# Patient Record
Sex: Female | Born: 1985 | Race: White | Hispanic: No | Marital: Single | Smoking: Never smoker
Health system: Southern US, Community
[De-identification: ages and names within clinical notes are randomized; demographics above are authoritative.]

---

## 2014-03-22 ENCOUNTER — Observation Stay (HOSPITAL_COMMUNITY)
Admission: EM | Admit: 2014-03-22 | Discharge: 2014-03-24 | Disposition: A | Discharge status: Home / Self Care | Payer: Medicaid Other | Attending: Family Medicine | Admitting: Family Medicine

## 2014-03-22 ENCOUNTER — Emergency Department (HOSPITAL_COMMUNITY): Payer: Medicaid Other

## 2014-03-22 ENCOUNTER — Encounter (HOSPITAL_COMMUNITY): Payer: Self-pay | Admitting: Emergency Medicine

## 2014-03-22 DIAGNOSIS — O99891 Other specified diseases and conditions complicating pregnancy: Principal | ICD-10-CM | POA: Insufficient documentation

## 2014-03-22 DIAGNOSIS — R310 Gross hematuria: Secondary | ICD-10-CM | POA: Insufficient documentation

## 2014-03-22 DIAGNOSIS — O468X9 Other antepartum hemorrhage, unspecified trimester: Secondary | ICD-10-CM | POA: Insufficient documentation

## 2014-03-22 DIAGNOSIS — E860 Dehydration: Secondary | ICD-10-CM | POA: Insufficient documentation

## 2014-03-22 DIAGNOSIS — O4590 Premature separation of placenta, unspecified, unspecified trimester: Secondary | ICD-10-CM

## 2014-03-22 DIAGNOSIS — A084 Viral intestinal infection, unspecified: Secondary | ICD-10-CM | POA: Diagnosis present

## 2014-03-22 DIAGNOSIS — O9989 Other specified diseases and conditions complicating pregnancy, childbirth and the puerperium: Principal | ICD-10-CM

## 2014-03-22 DIAGNOSIS — O468X1 Other antepartum hemorrhage, first trimester: Secondary | ICD-10-CM

## 2014-03-22 DIAGNOSIS — K5289 Other specified noninfective gastroenteritis and colitis: Secondary | ICD-10-CM

## 2014-03-22 DIAGNOSIS — R519 Headache, unspecified: Secondary | ICD-10-CM | POA: Diagnosis present

## 2014-03-22 DIAGNOSIS — R51 Headache: Secondary | ICD-10-CM | POA: Insufficient documentation

## 2014-03-22 DIAGNOSIS — Z3490 Encounter for supervision of normal pregnancy, unspecified, unspecified trimester: Secondary | ICD-10-CM

## 2014-03-22 DIAGNOSIS — O210 Mild hyperemesis gravidarum: Secondary | ICD-10-CM

## 2014-03-22 DIAGNOSIS — K529 Noninfective gastroenteritis and colitis, unspecified: Secondary | ICD-10-CM

## 2014-03-22 DIAGNOSIS — O211 Hyperemesis gravidarum with metabolic disturbance: Secondary | ICD-10-CM | POA: Insufficient documentation

## 2014-03-22 DIAGNOSIS — E873 Alkalosis: Secondary | ICD-10-CM | POA: Insufficient documentation

## 2014-03-22 DIAGNOSIS — O418X10 Other specified disorders of amniotic fluid and membranes, first trimester, not applicable or unspecified: Secondary | ICD-10-CM | POA: Diagnosis present

## 2014-03-22 DIAGNOSIS — A088 Other specified intestinal infections: Secondary | ICD-10-CM | POA: Insufficient documentation

## 2014-03-22 LAB — CBC WITH DIFFERENTIAL/PLATELET
Basophils Absolute: 0 10*3/uL (ref 0.0–0.1)
Basophils Relative: 0 % (ref 0–1)
EOS ABS: 0 10*3/uL (ref 0.0–0.7)
Eosinophils Relative: 0 % (ref 0–5)
HCT: 41.9 % (ref 36.0–46.0)
HEMOGLOBIN: 14.4 g/dL (ref 12.0–15.0)
LYMPHS ABS: 0.9 10*3/uL (ref 0.7–4.0)
LYMPHS PCT: 6 % — AB (ref 12–46)
MCH: 32.5 pg (ref 26.0–34.0)
MCHC: 34.4 g/dL (ref 30.0–36.0)
MCV: 94.6 fL (ref 78.0–100.0)
MONOS PCT: 3 % (ref 3–12)
Monocytes Absolute: 0.5 10*3/uL (ref 0.1–1.0)
NEUTROS PCT: 90 % — AB (ref 43–77)
Neutro Abs: 12.5 10*3/uL — ABNORMAL HIGH (ref 1.7–7.7)
Platelets: 233 10*3/uL (ref 150–400)
RBC: 4.43 MIL/uL (ref 3.87–5.11)
RDW: 12.4 % (ref 11.5–15.5)
WBC: 13.9 10*3/uL — AB (ref 4.0–10.5)

## 2014-03-22 LAB — URINALYSIS, ROUTINE W REFLEX MICROSCOPIC
Bilirubin Urine: NEGATIVE
Glucose, UA: NEGATIVE mg/dL
Ketones, ur: NEGATIVE mg/dL
Leukocytes, UA: NEGATIVE
Nitrite: NEGATIVE
PROTEIN: NEGATIVE mg/dL
Specific Gravity, Urine: 1.03 (ref 1.005–1.030)
Urobilinogen, UA: 1 mg/dL (ref 0.0–1.0)
pH: 5.5 (ref 5.0–8.0)

## 2014-03-22 LAB — COMPREHENSIVE METABOLIC PANEL
ALK PHOS: 86 U/L (ref 39–117)
ALT: 23 U/L (ref 0–35)
AST: 22 U/L (ref 0–37)
Albumin: 4.1 g/dL (ref 3.5–5.2)
BILIRUBIN TOTAL: 0.8 mg/dL (ref 0.3–1.2)
BUN: 8 mg/dL (ref 6–23)
CO2: 18 mEq/L — ABNORMAL LOW (ref 19–32)
Calcium: 9.3 mg/dL (ref 8.4–10.5)
Chloride: 98 mEq/L (ref 96–112)
Creatinine, Ser: 0.56 mg/dL (ref 0.50–1.10)
GFR calc Af Amer: >90 mL/min (ref >90)
GFR calc non Af Amer: >90 mL/min (ref >90)
GLUCOSE: 108 mg/dL — AB (ref 70–99)
POTASSIUM: 4.2 meq/L (ref 3.7–5.3)
Sodium: 136 mEq/L — ABNORMAL LOW (ref 137–147)
TOTAL PROTEIN: 7.5 g/dL (ref 6.0–8.3)

## 2014-03-22 LAB — URINE MICROSCOPIC-ADD ON

## 2014-03-22 LAB — HCG, QUANTITATIVE, PREGNANCY: hCG, Beta Chain, Quant, S: 56946 m[IU]/mL — ABNORMAL HIGH (ref <5)

## 2014-03-22 LAB — LIPASE, BLOOD: LIPASE: 12 U/L (ref 11–59)

## 2014-03-22 MED ORDER — ONDANSETRON HCL 4 MG/2ML IJ SOLN: 4.0000 mg | Freq: Four times a day (QID) | INTRAMUSCULAR | Status: DC | PRN

## 2014-03-22 MED ORDER — DEXTROSE-NACL 5-0.45 % IV SOLN
INTRAVENOUS | Status: DC
  Administered 2014-03-23 – 2014-03-24 (×5): via INTRAVENOUS

## 2014-03-22 MED ORDER — SODIUM CHLORIDE 0.9 % IV BOLUS (SEPSIS)
1000.0000 mL | Freq: Once | INTRAVENOUS | Status: AC
  Administered 2014-03-22: 1000 mL via INTRAVENOUS

## 2014-03-22 MED ORDER — ONDANSETRON HCL 4 MG PO TABS: 4.0000 mg | ORAL_TABLET | Freq: Four times a day (QID) | ORAL | Status: DC | PRN

## 2014-03-22 MED ORDER — ACETAMINOPHEN 10 MG/ML IV SOLN
1000.0000 mg | Freq: Four times a day (QID) | INTRAVENOUS | Status: DC | PRN
  Administered 2014-03-22: 1000 mg via INTRAVENOUS
  Filled 2014-03-22: qty 100

## 2014-03-22 MED ORDER — SODIUM CHLORIDE 0.9 % IV SOLN
INTRAVENOUS | Status: DC
  Administered 2014-03-22: 23:00:00 via INTRAVENOUS

## 2014-03-22 MED ORDER — METOCLOPRAMIDE HCL 5 MG/ML IJ SOLN
10.0000 mg | Freq: Once | INTRAMUSCULAR | Status: AC
  Administered 2014-03-22: 10 mg via INTRAVENOUS
  Filled 2014-03-22: qty 2

## 2014-03-22 MED ORDER — ONDANSETRON HCL 4 MG/2ML IJ SOLN
4.0000 mg | Freq: Once | INTRAMUSCULAR | Status: AC
  Administered 2014-03-22: 4 mg via INTRAVENOUS
  Filled 2014-03-22: qty 2

## 2014-03-22 MED ORDER — ONDANSETRON HCL 4 MG/2ML IJ SOLN
4.0000 mg | Freq: Three times a day (TID) | INTRAMUSCULAR | Status: DC | PRN
Start: 2014-03-22 — End: 2014-03-22

## 2014-03-22 MED ORDER — METOCLOPRAMIDE HCL 5 MG/ML IJ SOLN: 10.0000 mg | Freq: Four times a day (QID) | INTRAMUSCULAR | Status: DC | PRN

## 2014-03-22 MED ORDER — ONDANSETRON 4 MG PO TBDP
4.0000 mg | ORAL_TABLET | Freq: Once | ORAL | Status: AC
  Administered 2014-03-22: 4 mg via ORAL
  Filled 2014-03-22: qty 1

## 2014-03-22 NOTE — ED Notes (Addendum)
Pt in c/o n/v/d since around 530 this am, also body aches and chills, low grade fever at home- states she is [redacted] weeks pregnant but these symptoms are new for her, no distress noted- denies vaginal bleeding, c/o abdominal cramping with nausea, states the pain is constant and worse after eating or drinking

## 2014-03-22 NOTE — H&P (Signed)
Family Medicine Teaching [facility] Admission History and Physical Service Pager: [phone]  Patient name: Melanie Vargas Medical record number: [id number] Date of birth: 03/21/1986 Age: 28 y.o. Gender: female  Primary Care Provider: No PCP Per Patient Consultants: None Code Status: Full  Chief Complaint: Nausea, vomiting, dehydration  Assessment and Plan: Melanie Vargas is a 28 y.o. G3P2002 at 3312w1d by US consistent with LMP presenting with nausea, vomiting and diarrhea consistent with viral gastroenteritis superimposed on hyperemesis gravidarum. PMH is significant for hyperemesis with 2 prior pregnancies.  Viral gastroenteritis: N/V/D after exposure to children with GI bug, causing dehydration. No bloody stool. Leukocytosis more likely due to pregnancy. Supportive management.  - Reglan 10mg  IV q6h prn, zofran 4mg  IV q8h prn - NPO, IVF - Repeat CBC and BMP in AM - Will hold off on stool cultures, ova, parasites unless not improving.   Dehydration: Due to above. S/p 1L NS bolus in ED. HR improved 97 > 75; BP normo/borderline hypotensive (91/68) which pt reports is baseline. Cr/BUN appropriately decreased. Dry mucous membranes.  - Repeat NS bolus and continue MIVF with dextrose.  - Restart diet once nausea and emesis controlled - Bicarbonate 18, borderline low likely due to accomodation of respiratory alkalosis combined with GI loss due to diarrhea. Would suspect contraction alkalosis given degree of dehydration and hypochloremic alkalosis with emesis. Repeat BMP in AM  Single, viable IUP: At 1612w1d by U/S consistent with LMP and hCG 56,946. FHR 158 bpm. - Continue PNV - Would likely benefit from diclegis at discharge - U/A shows rare bacteria, 0-2 WBC, neg LE and nitrites. Hold tx pending urine culture.   Subchorionic hemorrhage, moderate: Hgb 14.4. No indication of location, but does carry somewhat increased risk of spontaneous abortion.  - Expectant management - Consider repeat U/S in  1-2 weeks to monitor   FEN/GI: 1L NS bolus, then D5 1/2NS @ 125cc/hr; NPO at this time.  Prophylaxis: SCDs, ambulation  Disposition: Admit for observation on FMTS  History of Present Illness: Melanie Vargas is a 28 y.o. G3P2002 at 5212w1d presenting with nausea, vomiting and diarrhea.   She has had nausea, vomiting and diarrhea since 5:30am this morning. She had previously been somewhat nauseated with rare emesis during this pregnancy. This continued throughout the day so she presented to the ED where she was unable to keep water down. She reports multiple large-volume diarrhea episodes without blood. Her two children had a stomach virus about 1 week ago. She reports low-grade fever. No suspicious injections, no travel. Denies abdominal pain. Zofran has improved but not resolved  She is a G3P2002 at 5712w1d by U/S today in agreement with dating by LMP. Previous 2 pregnancies were carried to term and delivered by NSVD without other complications. Had hyperemesis gravidarum with both. They were treated with IV fluids and antiemetics and did not require admission. She lives in [place] and has an appointment [facility] in [place].  Denies vaginal bleeding, contractions. She reports a mild frontal throbbing headache that started a couple of hours ago, not associated with changes in vision. She has a history of migraines and this is similar.   She takes only PNV  No EtOH, illicit drugs or prescription medications.   Review Of Systems: Per HPI Otherwise 12 point review of systems was performed and was unremarkable.  Patient Active Problem List   Diagnosis Date Noted  . Hyperemesis gravidarum 03/22/2014   Past Medical History: History reviewed. No pertinent past medical history. Occasional migraines  Past  Surgical History: History reviewed. No pertinent past surgical history. No surgeries  Social History: Never smoker.  Additional social history per HPI Please also refer to  relevant sections of EMR.  Family History: History reviewed. No pertinent family history.  Allergies and Medications: No Known Allergies Takes prenatal vitamin  Objective: BP 106/43  Pulse 64  Temp(Src) 98 F (36.7 C) (Oral)  Resp 16  Ht 5\' 7"  (1.702 m)  Wt 180 lb 1.9 oz (81.7 kg)  BMI 28.20 kg/m2  SpO2 100%  LMP 01/23/2014 Exam: General: Pleasant, ill-appearing 28 y.o. female in NAD HEENT: Dry mucous membranes, PERRL, EOMI, oropharynx clear Cardiovascular: RRR, no murmur, no JVD Respiratory: Nonlabored on ambient air, CTAB Abdomen: Normoactive BS, soft, NT, ND, fundus not palpated Extremities: WWP, trace pitting LE edema symmetrically, negative homan's  Skin: No rash, wound, lesions. Tattoo on left foot Neuro: Alert and oriented, normal speech.   Labs and Imaging: CBC BMET   Recent Labs Lab 03/22/14 1532  WBC 13.9*  HGB 14.4  HCT 41.9  PLT 233    Recent Labs Lab 03/22/14 1532  NA 136*  K 4.2  CL 98  CO2 18*  BUN 8  CREATININE 0.56  GLUCOSE 108*  CALCIUM 9.3     TRANS-ABDOMINAL AND TRANS-VAGINAL U/S (03/22/14) Intrauterine gestational sac: Visualized/normal in shape.  Yolk sac: Visualize.  Embryo: Present.  Cardiac Activity: Present.  Heart Rate: 158 bpm  CRL: 16.8 mm 8 w 1d US EDC: 10/31/2014  Maternal uterus/adnexae: There is no uterine mass. There is a  moderate size subchorionic hemorrhage. The right ovary is normal  appearing in the left ovary is not visualized. There is no pelvic  free fluid.  IMPRESSION:  1. Single live intrauterine pregnancy dating 8 weeks 1 day with an  ultrasound EDC of 10/31/2014.  2. Moderate-sized subchorionic hemorrhage. Attention on follow-up  examination is recommended.   Melanie Junkeryan Grunz, MD 03/22/2014, 10:54 PM PGY-1, [facility] FPTS Intern pager: [phone], text pages welcome  Patient seen, examined. Available data reviewed. Agree with findings, assessment, and plan as outlined by Dr. Jarvis NewcomerGrunz.  My  additional findings are documented and highlighted above in blue.   Melanie Vargas, PGY-3 Family Medicine Resident

## 2014-03-22 NOTE — ED Notes (Signed)
Pt c/o nausea; Dr. Radford PaxBeaton VO 4 mg Zofran

## 2014-03-22 NOTE — ED Provider Notes (Signed)
CSN: [id number]     Arrival date & time 03/22/14  1442 History   First MD Initiated Contact with Patient 03/22/14 1853     Chief Complaint  Patient presents with  . N/V/D       HPI Pt in c/o n/v/d since around 530 this am, also body aches and chills, low grade fever at home- states she is [redacted] weeks pregnant but these symptoms are new for her, patient has had history of hyperemesis gravidarum in the past.  This is her third pregnancy.  Patient's had no previous intra-abdominal surgeries.  Patient denies abdominal bleeding. no distress noted- denies vaginal bleeding.  Patient is 8 weeks by history.  Has not had an ultrasound today.  Last 2 pregnancies her term.  She is gravida 3 para 2 AB 0.  History reviewed. No pertinent past medical history. History reviewed. No pertinent past surgical history. History reviewed. No pertinent family history. History  Substance Use Topics  . Smoking status: Never Smoker   . Smokeless tobacco: Not on file  . Alcohol Use: Not on file   OB History   Grav Para Term Preterm Abortions TAB SAB Ect Mult Living   1              Review of Systems  All other systems reviewed and are negative  Allergies  Review of patient's allergies indicates no known allergies.  Home Medications   Prior to Admission medications   Medication Sig Start Date End Date Taking? Authorizing Provider  ondansetron (ZOFRAN-ODT) 4 MG disintegrating tablet Take 1 tablet (4 mg total) by mouth every 8 (eight) hours as needed for nausea or vomiting. 03/24/14   Hazeline Junkeryan Grunz, MD   BP 98/66  Pulse 57  Temp(Src) 97.6 F (36.4 C) (Oral)  Resp 18  Ht 5\' 7"  (1.702 m)  Wt 180 lb 1.9 oz (81.7 kg)  BMI 28.20 kg/m2  SpO2 100%  LMP 01/23/2014 Physical Exam  Nursing note and vitals reviewed. Constitutional: She is oriented to person, place, and time. She appears well-developed and well-nourished. No distress.  HENT:  Head: Normocephalic and atraumatic.  Eyes: Pupils are equal, round, and  reactive to light.  Neck: Normal range of motion.  Cardiovascular: Normal rate and intact distal pulses.   Pulmonary/Chest: No respiratory distress.  Abdominal: Normal appearance. She exhibits no distension. There is no tenderness. There is no rebound and no guarding.  Musculoskeletal: Normal range of motion.  Neurological: She is alert and oriented to person, place, and time. No cranial nerve deficit.  Skin: Skin is warm and dry. No rash noted.  Psychiatric: She has a normal mood and affect. Her behavior is normal.    ED Course  Procedures (including critical care time) Labs Review Labs Reviewed  CBC WITH DIFFERENTIAL - Abnormal; Notable for the following:    WBC 13.9 (*)    Neutrophils Relative % 90 (*)    Neutro Abs 12.5 (*)    Lymphocytes Relative 6 (*)    All other components within normal limits  COMPREHENSIVE METABOLIC PANEL - Abnormal; Notable for the following:    Sodium 136 (*)    CO2 18 (*)    Glucose, Bld 108 (*)    All other components within normal limits  HCG, QUANTITATIVE, PREGNANCY - Abnormal; Notable for the following:    hCG, Beta Chain, Quant, [place] [id number] (*)    All other components within normal limits  URINALYSIS, ROUTINE W REFLEX MICROSCOPIC - Abnormal; Notable for the following:  Color, Urine AMBER (*)    Hgb urine dipstick MODERATE (*)    All other components within normal limits  URINE MICROSCOPIC-ADD ON - Abnormal; Notable for the following:    Squamous Epithelial / LPF FEW (*)    All other components within normal limits  BASIC METABOLIC PANEL - Abnormal; Notable for the following:    Sodium 136 (*)    Potassium 3.5 (*)    Glucose, Bld 110 (*)    BUN 4 (*)    Creatinine, Ser 0.49 (*)    Calcium 8.3 (*)    All other components within normal limits  CBC - Abnormal; Notable for the following:    RBC 3.64 (*)    Hemoglobin 11.8 (*)    HCT 34.5 (*)    All other components within normal limits  BASIC METABOLIC PANEL - Abnormal; Notable for the  following:    BUN <3 (*)    All other components within normal limits  URINE CULTURE  LIPASE, BLOOD  TYPE AND SCREEN  ABO/RH    Medications  ondansetron (ZOFRAN-ODT) disintegrating tablet 4 mg (4 mg Oral Given 03/22/14 1519)  sodium chloride 0.9 % bolus 1,000 mL (0 mLs Intravenous Stopped 03/22/14 2205)  ondansetron (ZOFRAN) injection 4 mg (4 mg Intravenous Given 03/22/14 2056)  metoCLOPramide (REGLAN) injection 10 mg (10 mg Intravenous Given 03/22/14 2213)  sodium chloride 0.9 % bolus 1,000 mL (1,000 mLs Intravenous Given 03/22/14 2337)  potassium chloride SA (K-DUR,KLOR-CON) CR tablet 40 mEq (40 mEq Oral Given 03/23/14 1010)  potassium chloride SA (K-DUR,KLOR-CON) CR tablet 40 mEq (40 mEq Oral Given 03/24/14 1001)      Anion gap = 20 Imaging Review IMPRESSION: 1. Single live intrauterine pregnancy dating 8 weeks 1 day with an ultrasound EDC of 10/31/2014. 2. Moderate-sized subchorionic hemorrhage. Attention on follow-up examination is recommended.    Discussed with obstetrics gynecology who recommended patient to be admitted to medical floor with rehydration and control of vomiting.  Contact family practice who agreed to admit the patient for treatment. MDM   Final diagnoses:  Hyperemesis gravidarum        Nelia Shiobert L Janathan Bribiesca, MD 03/25/14 1123

## 2014-03-23 DIAGNOSIS — R519 Headache, unspecified: Secondary | ICD-10-CM | POA: Diagnosis present

## 2014-03-23 DIAGNOSIS — E860 Dehydration: Secondary | ICD-10-CM | POA: Diagnosis present

## 2014-03-23 DIAGNOSIS — A084 Viral intestinal infection, unspecified: Secondary | ICD-10-CM | POA: Diagnosis present

## 2014-03-23 DIAGNOSIS — R51 Headache: Secondary | ICD-10-CM | POA: Diagnosis present

## 2014-03-23 DIAGNOSIS — O418X10 Other specified disorders of amniotic fluid and membranes, first trimester, not applicable or unspecified: Secondary | ICD-10-CM | POA: Diagnosis present

## 2014-03-23 DIAGNOSIS — O468X1 Other antepartum hemorrhage, first trimester: Secondary | ICD-10-CM

## 2014-03-23 DIAGNOSIS — Z3490 Encounter for supervision of normal pregnancy, unspecified, unspecified trimester: Secondary | ICD-10-CM

## 2014-03-23 LAB — CBC
HEMATOCRIT: 34.5 % — AB (ref 36.0–46.0)
HEMOGLOBIN: 11.8 g/dL — AB (ref 12.0–15.0)
MCH: 32.4 pg (ref 26.0–34.0)
MCHC: 34.2 g/dL (ref 30.0–36.0)
MCV: 94.8 fL (ref 78.0–100.0)
Platelets: 168 10*3/uL (ref 150–400)
RBC: 3.64 MIL/uL — ABNORMAL LOW (ref 3.87–5.11)
RDW: 12.5 % (ref 11.5–15.5)
WBC: 6.7 10*3/uL (ref 4.0–10.5)

## 2014-03-23 LAB — BASIC METABOLIC PANEL
BUN: 4 mg/dL — ABNORMAL LOW (ref 6–23)
CHLORIDE: 102 meq/L (ref 96–112)
CO2: 19 mEq/L (ref 19–32)
Calcium: 8.3 mg/dL — ABNORMAL LOW (ref 8.4–10.5)
Creatinine, Ser: 0.49 mg/dL — ABNORMAL LOW (ref 0.50–1.10)
GFR calc non Af Amer: >90 mL/min (ref >90)
Glucose, Bld: 110 mg/dL — ABNORMAL HIGH (ref 70–99)
Potassium: 3.5 mEq/L — ABNORMAL LOW (ref 3.7–5.3)
Sodium: 136 mEq/L — ABNORMAL LOW (ref 137–147)

## 2014-03-23 LAB — TYPE AND SCREEN
ABO/RH(D): O POS
ANTIBODY SCREEN: NEGATIVE

## 2014-03-23 LAB — ABO/RH: ABO/RH(D): O POS

## 2014-03-23 MED ORDER — ONDANSETRON 4 MG PO TBDP: 4.0000 mg | ORAL_TABLET | Freq: Three times a day (TID) | ORAL | Status: DC | PRN

## 2014-03-23 MED ORDER — ACETAMINOPHEN 325 MG PO TABS
650.0000 mg | ORAL_TABLET | Freq: Four times a day (QID) | ORAL | Status: DC | PRN
  Administered 2014-03-23 (×2): 650 mg via ORAL
  Filled 2014-03-23 (×2): qty 2

## 2014-03-23 MED ORDER — ONDANSETRON 4 MG PO TBDP
4.0000 mg | ORAL_TABLET | Freq: Three times a day (TID) | ORAL | Status: DC | PRN
  Administered 2014-03-23 – 2014-03-24 (×2): 4 mg via ORAL
  Filled 2014-03-23 (×2): qty 1

## 2014-03-23 MED ORDER — CALCIUM CARBONATE ANTACID 500 MG PO CHEW
1.0000 | CHEWABLE_TABLET | Freq: Four times a day (QID) | ORAL | Status: DC | PRN
  Filled 2014-03-23 (×3): qty 1

## 2014-03-23 MED ORDER — POTASSIUM CHLORIDE CRYS ER 20 MEQ PO TBCR
40.0000 meq | EXTENDED_RELEASE_TABLET | Freq: Once | ORAL | Status: AC
  Administered 2014-03-23: 40 meq via ORAL
  Filled 2014-03-23: qty 2

## 2014-03-23 MED ORDER — METOCLOPRAMIDE HCL 10 MG PO TABS
10.0000 mg | ORAL_TABLET | Freq: Four times a day (QID) | ORAL | Status: DC | PRN
  Administered 2014-03-23 – 2014-03-24 (×2): 10 mg via ORAL
  Filled 2014-03-23 (×2): qty 1

## 2014-03-23 NOTE — H&P (Signed)
FMTS Attending Note  I personally saw and evaluated the patient. The plan of care was discussed with the resident team. I agree with the assessment and plan as documented by the resident.   28 y/o G3P2002 at 139w2d by [place] consistent with LMP presents with nausea/vomiting/diarrhea. Symptoms have resolved this morning, she is now feeling hungry, no abdominal pain, no abdominal or pelvic cramping, she reports some hematuria that occurred after traumatic catheter yesterday, she denies vaginal bleeding or hematuria prior to this, please refer to resident note for additional HPI.  Vitals: reviewed Gen: pleasant caucasian female, NAD Cardiac: RRR, S1 and S2 present, no murmurs, no heaves/thrills Resp: CTAB, normal effort Abd: soft, no tenderness, normal bowel sounds  Reviewed lab work and imaging.  Assessment and Plan: 28 y/o G3P2002 admitted with nausea/emesis. 1. Viral gastroenteritis vs Hyperemesis - improved with antiemetics, advance diet this morning 2. Subchorionic Hemorrhage - patient thinks blood type is O+ however is unsure, will check blood type to determine need for Rhogam 3. Hematuria - likely due to traumatic catheter, do not suspect vaginal/uterine origin as patient was asymptomatic prior to catheter and only has had hematuria (no spotting), agree with urine culture in setting of pregnancy, will need follow up UA as outpatient to evaluate for resolution  Donnella ShamKyle Muhamad Serano MD

## 2014-03-23 NOTE — Progress Notes (Signed)
FMTS Attending Daily Note:  Renold DonJeff Walden MD  [phone] pager  Family Practice pager:  [phone] I have discussed this patient with the resident Dr. Jarvis NewcomerGrunz and attending physician Dr. Randolm IdolFletke.  I agree with their findings, assessment, and care plan

## 2014-03-23 NOTE — Progress Notes (Signed)
Family Medicine Teaching Service Daily Progress Note Intern Pager: [phone]  Patient name: Melanie LoanBrittni Ponzo Medical record number: [id number] Date of birth: 06-29-1986 Age: 28 y.o. Gender: female  Primary Care Provider: No PCP Per Patient Consultants: None Code Status: Full  Pt Overview and Major Events to Date:  6/15: Admitted with dehydration due to viral gastroenteritis  Assessment and Plan: Donda Noboa is a 28 y.o. G3P2002 at 427w1d by US consistent with LMP presenting with nausea, vomiting and diarrhea consistent with viral gastroenteritis superimposed on hyperemesis gravidarum. PMH is significant for hyperemesis with 2 prior pregnancies.   Viral gastroenteritis: N/V/D after exposure to children with GI bug, causing dehydration. No bloody stool. Supportive management.  - Reglan 10mg  IV q6h prn, zofran 4mg  IV q8h prn  - Advance diet as tolerated  Dehydration: Due to above. S/p 1L NS bolus in ED. HR improved 97 > 75; BP normo/borderline hypotensive (91/68) which pt reports is baseline.  - Bicarbonate 18 > 19 - Repeat bloowork shows dramatic dilutional changes suggesting hemoconcentration on presentation.   Single, viable IUP: At 5627w1d by U/S 6/15 consistent with LMP and hCG 56,946. FHR 158 bpm.  - Continue PNV  - Would likely benefit from pyridoxine and doxylamine at discharge  Hematuria: Secondary to traumatic catheterization U/A shows rare bacteria, 0-2 WBC, neg LE and nitrites.  - Hold tx pending urine culture.   Subchorionic hemorrhage, moderate: Hgb 14.4. No indication of location, but does carry somewhat increased risk of spontaneous abortion.  - Expectant management  - Repeat U/S in 1-2 weeks to monitor  - Type & screen, if Rh negative, will get kleihauer-betke stain and administer rhogam  FEN/GI: D5 1/2NS @ 150cc/hr; advance diet as tolerated  Prophylaxis: SCDs, ambulation  Disposition: If tolerates an advanced diet can D/C today.   Subjective:  Feels "much better" still  with a bit of nausea but no emesis or diarrhea since arriving to her room. She is hungry and would like to eat and leave the hospital later today. No contractions or vaginal bleeding, some hematuria since traumatic I&O cath in ED.   Objective: Temp:  [97.1 F (36.2 C)-98.2 F (36.8 C)] 97.1 F (36.2 C) (06/16 0514) Pulse Rate:  [64-97] 65 (06/16 0514) Resp:  [16-18] 18 (06/16 0514) BP: (91-115)/(43-70) 101/59 mmHg (06/16 0514) SpO2:  [100 %] 100 % (06/16 0514) Weight:  [180 lb 1.9 oz (81.7 kg)] 180 lb 1.9 oz (81.7 kg) (06/15 2249) Physical Exam: General: Pleasant 28 y.o. female in NAD  HEENT: Moist mucous membranes, PERRL, EOMI, oropharynx clear  Cardiovascular: RRR, no murmur, no JVD  Respiratory: Nonlabored on ambient air, CTAB  Abdomen: Normoactive BS, soft, NT, ND, fundus not palpated  Extremities: WWP, trace pitting LE edema symmetrically, negative homan's   Laboratory:  Recent Labs Lab 03/22/14 1532 03/23/14 0455  WBC 13.9* 6.7  HGB 14.4 11.8*  HCT 41.9 34.5*  PLT 233 168    Recent Labs Lab 03/22/14 1532 03/23/14 0455  NA 136* 136*  K 4.2 3.5*  CL 98 102  CO2 18* 19  BUN 8 4*  CREATININE 0.56 0.49*  CALCIUM 9.3 8.3*  PROT 7.5  --   BILITOT 0.8  --   ALKPHOS 86  --   ALT 23  --   AST 22  --   GLUCOSE 108* 110*    Imaging/Diagnostic Tests: TRANS-ABDOMINAL AND TRANS-VAGINAL U/S (03/22/14)  Intrauterine gestational sac: Visualized/normal in shape.  Yolk sac: Visualize.  Embryo: Present.  Cardiac Activity: Present.  Heart  Rate: 158 bpm  CRL: 16.8 mm 8 w 1d US EDC: 10/31/2014  Maternal uterus/adnexae: There is no uterine mass. There is a  moderate size subchorionic hemorrhage. The right ovary is normal  appearing in the left ovary is not visualized. There is no pelvic  free fluid.  IMPRESSION:  1. Single live intrauterine pregnancy dating 8 weeks 1 day with an  ultrasound EDC of 10/31/2014.  2. Moderate-sized subchorionic hemorrhage. Attention on  follow-up  examination is recommended.   Hazeline Junkeryan Grunz, MD 03/23/2014, 7:23 AM PGY-1, [facility] FPTS Intern pager: [phone], text pages welcome

## 2014-03-23 NOTE — Progress Notes (Signed)
UR Completed.  Benigno Check Jane [phone] 03/23/2014  

## 2014-03-23 NOTE — Discharge Summary (Signed)
Family Medicine Teaching [facility] Discharge Summary  Patient name: Melanie LoanBrittni Vargas Medical record number: [id number] Date of birth: 1986/08/06 Age: 28 y.o. Gender: female Date of Admission: 03/22/2014  Date of Discharge: 03/24/2014 Admitting Physician: Lonia SkinnerStephanie E Losq, MD  Primary Care Provider: No PCP Per Patient Consultants: None  Indication for Hospitalization: Dehydration due to gastroenteritis   Discharge Diagnoses/Problem List:  Patient Active Problem List   Diagnosis Date Noted  . Subchorionic hemorrhage in first trimester 03/23/2014  . Viral gastroenteritis 03/23/2014  . Dehydration 03/23/2014  . Intrauterine pregnancy 03/23/2014  . Headache(784.0) 03/23/2014  . Hyperemesis gravidarum 03/22/2014   Disposition: Discharged home  Discharge Condition: Improved, stable  Discharge Exam:  Well appearing 28 y.o. female in NAD  MMM Abd soft, NT, ND, +BS  Brief Hospital Course:  Melanie Vargas is a 28 y.o. G3P2002 at 2447w1d by U/S consistent with LMP presenting with nausea, vomiting and diarrhea consistent with viral gastroenteritis superimposed on hyperemesis gravidarum. Past medical history is significant for hyperemesis with 2 prior pregnancies.   She reported exposure to children with GI bug prior to symptom onset. She was moderately dehydrated and despite having anti-emetics, she was unable to tolerate even water in the ED. Abdominal and vaginal U/S were performed showing a SIUP at 7847w1d consistent with LMP and hCG concentration (56k) as well as a moderate-sized subchorionic hemorrhage. She denied any vaginal bleeding or cramping and was not anemic. Type and screen showed that she is blood type O positive, so RhoGAM was not indicated. A follow up U/S in 1-2 weeks is recommended.   She was admitted for observation and rehydration. Heart rate lowered from 90s to 60s, she remained on the lower end of normotensive which is her baseline. Anti-emetics were given and her diet was  advanced beginning the morning after admission.   A urinalysis was obtained by I&O cath which was traumatic and caused hematuria. This was sent for culture and remained negative.  Hematuria: Secondary to traumatic catheterization U/A shows rare bacteria, 0-2 WBC, neg LE and nitrites.  - Hold tx pending urine culture.   Issues for Follow Up:  - Zofran prn was prescribed at discharge for resolving gastroenteritis. Will likely require treatment for hyperemsis.  - Follow up ultrasound to follow subchorionic hemorrhage recommended.  Significant Procedures: None  Significant Labs and Imaging:   Recent Labs Lab 03/22/14 1532 03/23/14 0455  WBC 13.9* 6.7  HGB 14.4 11.8*  HCT 41.9 34.5*  PLT 233 168    Recent Labs Lab 03/22/14 1532 03/23/14 0455 03/24/14 1053  NA 136* 136* 141  K 4.2 3.5* 4.0  CL 98 102 105  CO2 18* 19 23  GLUCOSE 108* 110* 81  BUN 8 4* <3*  CREATININE 0.56 0.49* 0.57  CALCIUM 9.3 8.3* 8.9  ALKPHOS 86  --   --   AST 22  --   --   ALT 23  --   --   ALBUMIN 4.1  --   --    TRANS-ABDOMINAL AND TRANS-VAGINAL U/S (03/22/14)  Intrauterine gestational sac: Visualized/normal in shape.  Yolk sac: Visualize.  Embryo: Present.  Cardiac Activity: Present.  Heart Rate: 158 bpm  CRL: 16.8 mm 8 w 1d US EDC: 10/31/2014  Maternal uterus/adnexae: There is no uterine mass. There is a  moderate size subchorionic hemorrhage. The right ovary is normal  appearing in the left ovary is not visualized. There is no pelvic  free fluid.  IMPRESSION:  1. Single live intrauterine pregnancy dating 8 weeks  1 day with an  ultrasound EDC of 10/31/2014.  2. Moderate-sized subchorionic hemorrhage. Attention on follow-up  examination is recommended.  Results/Tests Pending at Time of Discharge: None  Discharge Medications:    Medication List         ondansetron 4 MG disintegrating tablet  Commonly known as:  ZOFRAN-ODT  Take 1 tablet (4 mg total) by mouth every 8 (eight) hours as  needed for nausea or vomiting.       Discharge Instructions: Please refer to Patient Instructions section of EMR for full details.  Patient was counseled important signs and symptoms that should prompt return to medical care, changes in medications, dietary instructions, activity restrictions, and follow up appointments.   Follow-Up Appointments: Follow-up Information   Call OB/GYN.   Contact information:   [facility] [address], [place], [place] [address] [phone]     Hazeline Junkeryan Shenee Wignall, MD 03/24/2014, 12:16 PM PGY-1, [facility]

## 2014-03-24 DIAGNOSIS — Z331 Pregnant state, incidental: Secondary | ICD-10-CM

## 2014-03-24 LAB — BASIC METABOLIC PANEL
BUN: <3 mg/dL — ABNORMAL LOW (ref 6–23)
CO2: 23 mEq/L (ref 19–32)
Calcium: 8.9 mg/dL (ref 8.4–10.5)
Chloride: 105 mEq/L (ref 96–112)
Creatinine, Ser: 0.57 mg/dL (ref 0.50–1.10)
GFR calc Af Amer: >90 mL/min (ref >90)
Glucose, Bld: 81 mg/dL (ref 70–99)
POTASSIUM: 4 meq/L (ref 3.7–5.3)
SODIUM: 141 meq/L (ref 137–147)

## 2014-03-24 LAB — URINE CULTURE
COLONY COUNT: NO GROWTH
Culture: NO GROWTH

## 2014-03-24 MED ORDER — ONDANSETRON 4 MG PO TBDP: 4.0000 mg | ORAL_TABLET | Freq: Three times a day (TID) | ORAL | Status: AC | PRN

## 2014-03-24 MED ORDER — POTASSIUM CHLORIDE CRYS ER 20 MEQ PO TBCR
40.0000 meq | EXTENDED_RELEASE_TABLET | Freq: Once | ORAL | Status: AC
  Administered 2014-03-24: 40 meq via ORAL
  Filled 2014-03-24: qty 2

## 2014-03-24 NOTE — Discharge Summary (Signed)
Family Medicine Teaching Service  Discharge Note : Attending Jeff Walden MD Pager [phone] Inpatient Team Pager:  [phone]  I have reviewed this patient and the patient's chart and have discussed discharge planning with the resident at the time of discharge. I agree with the discharge plan as above.    

## 2014-03-24 NOTE — Discharge Instructions (Signed)
You were admitted for dehydration and improved with IV rehydration. You can continue taking zofran as needed if you continue to experience vomiting.   If you are unable to keep yourself hydrated, you should present to the Maternal Admissions Unit at [facility] on [address] in [place].   Follow up next week with your OB to discuss options for hyperemesis.   We hope you feel better soon,  - Family Medicine Teaching Service

## 2014-03-24 NOTE — Progress Notes (Signed)
UR Completed Brenda Graves-Bigelow, RN,BSN [phone]  

## 2014-03-24 NOTE — Progress Notes (Signed)
Discharge teaching done. Rx given and reviewed with pt. Pt stated that she is feeling better and that the Zofran has helped. Pt ready for discharge. Pt states she will leave after she is done with eating lunch.

## 2014-08-09 ENCOUNTER — Encounter (HOSPITAL_COMMUNITY): Payer: Self-pay | Admitting: Emergency Medicine

## 2015-03-29 IMAGING — US US OB COMP LESS 14 WK
1 series · 14 of 28 positions shown · non-contrast
Comparison: None.

CLINICAL DATA: Lower abdominal pain cramping

EXAM:
OBSTETRIC <14 WK US AND TRANSVAGINAL OB US
TECHNIQUE: Both transabdominal and transvaginal ultrasound examinations were
performed for complete evaluation of the gestation as well as the
maternal uterus, adnexal regions, and pelvic cul-de-sac.
Transvaginal technique was performed to assess early pregnancy.

[Series 1: us ob comp less 14 wk · 0.20mm/px · 78 acquisitions, 14 frames shown]
[im 3/78]
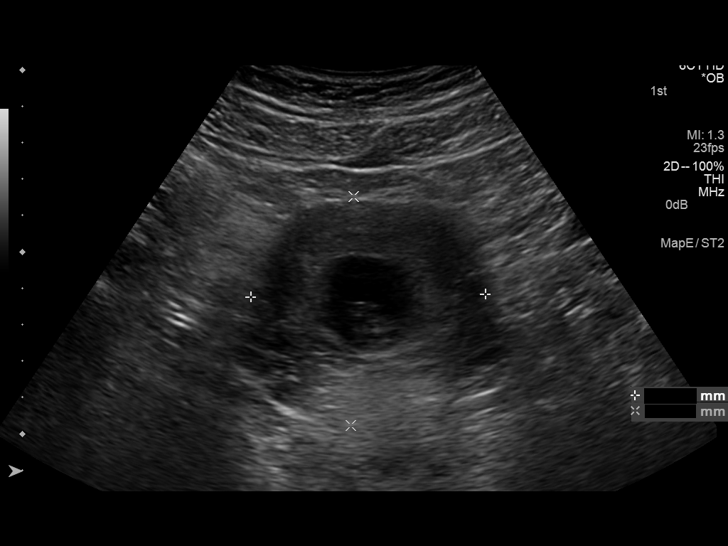
[im 9/78]
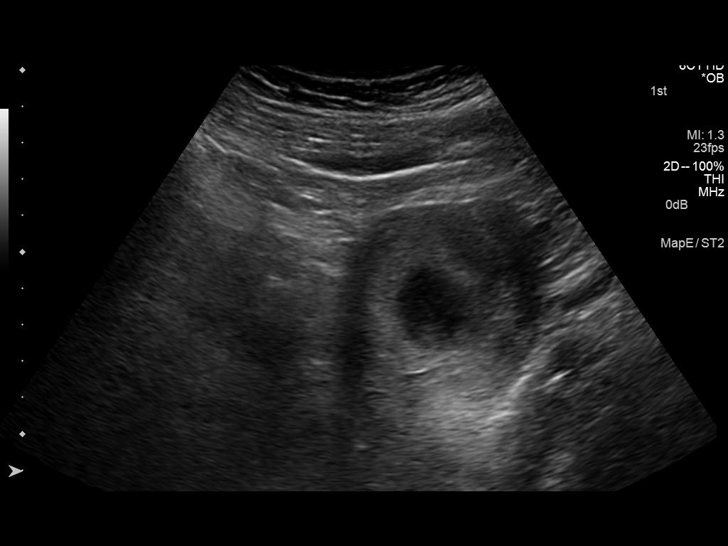
[im 15/78]
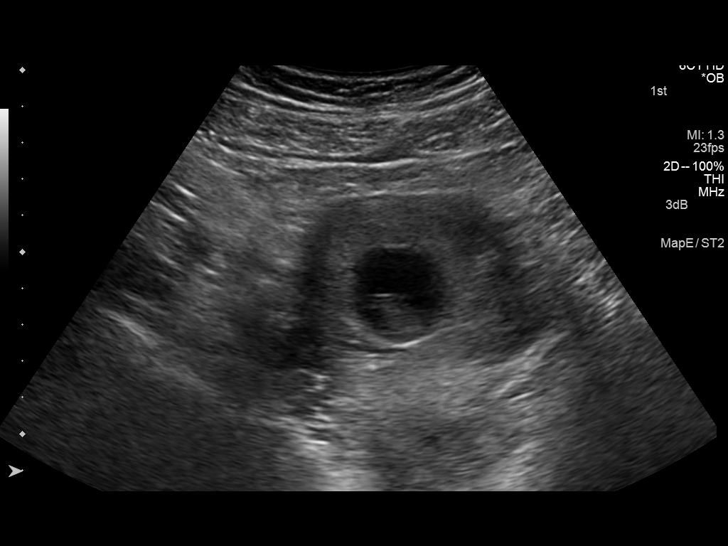
[im 20/78]
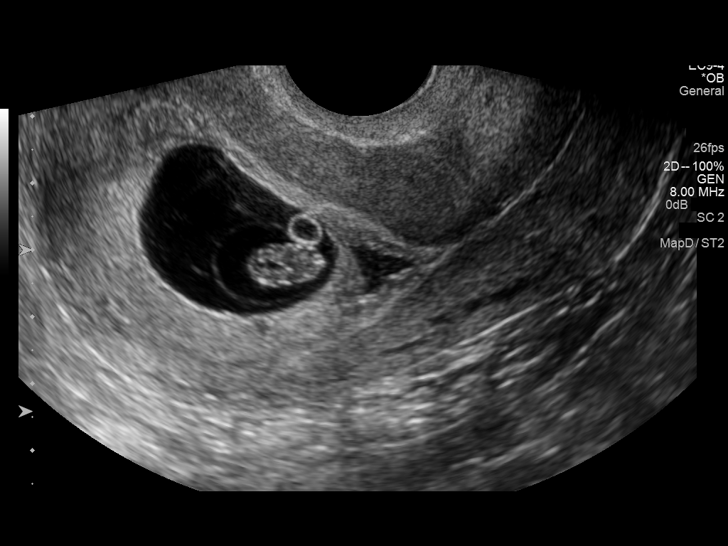
[im 26/78]
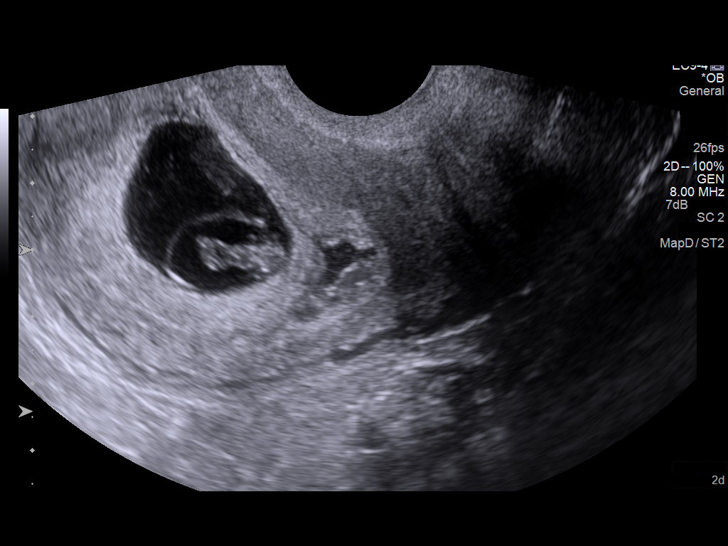
[im 32/78]
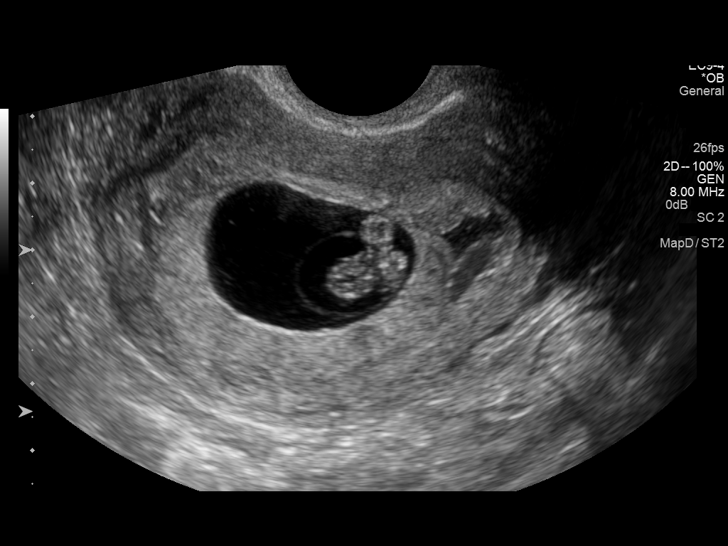
[im 38/78]
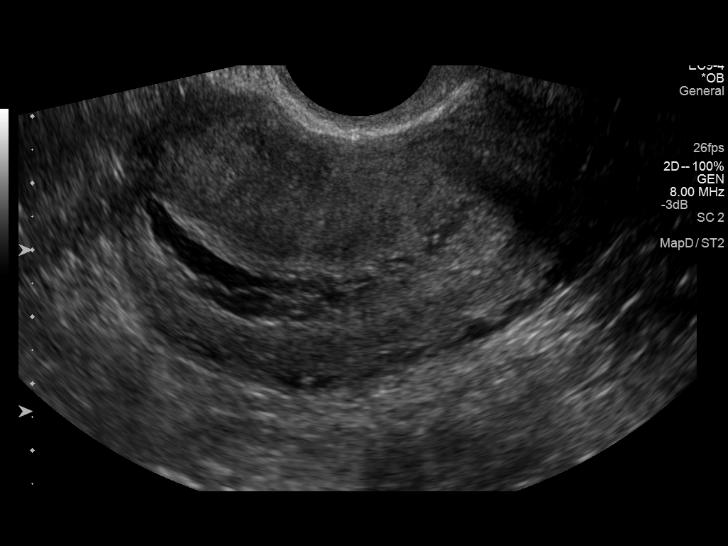
[im 43/78]
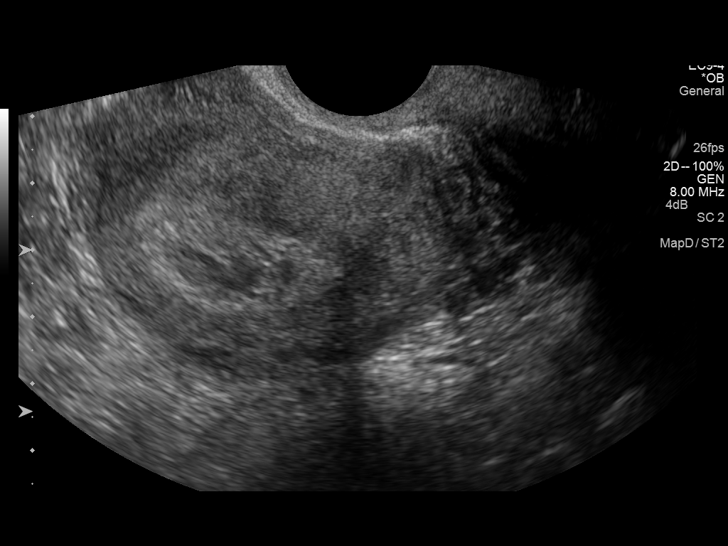
[im 49/78]
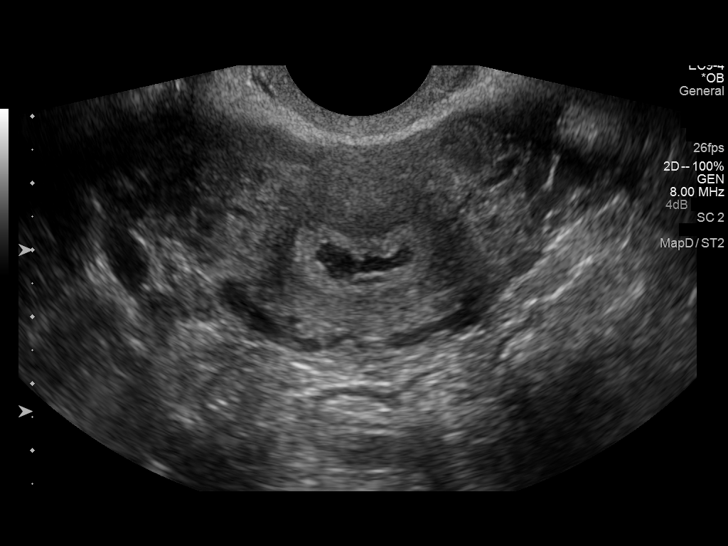
[im 55/78]
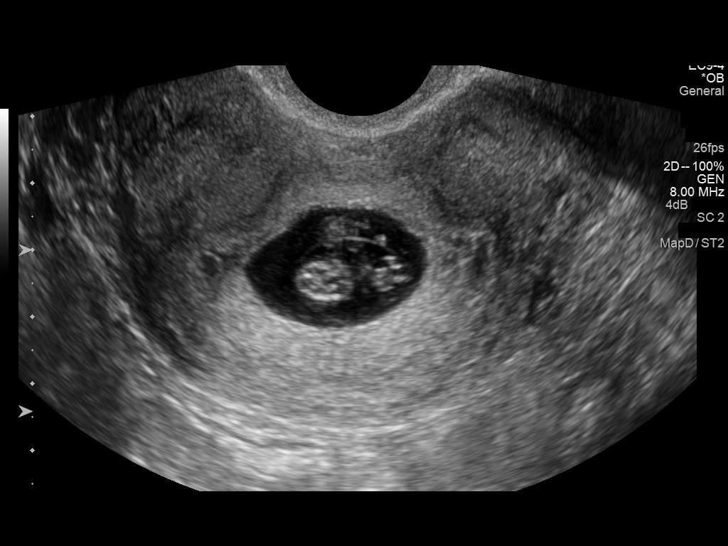
[im 60/78]
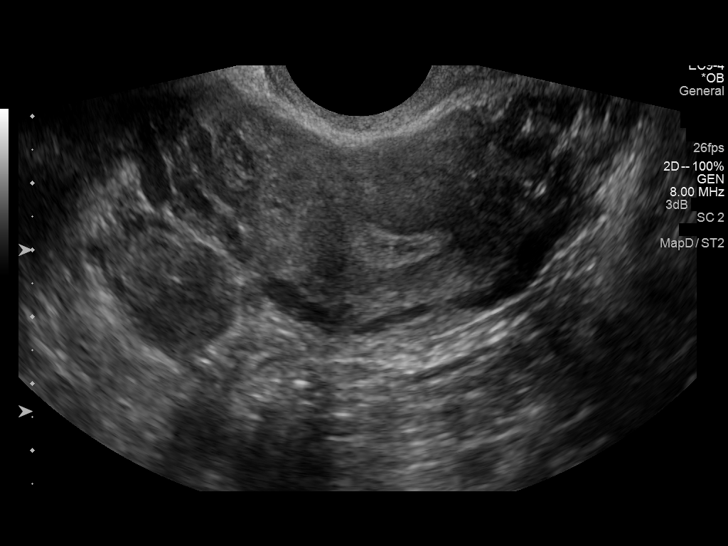
[im 66/78]
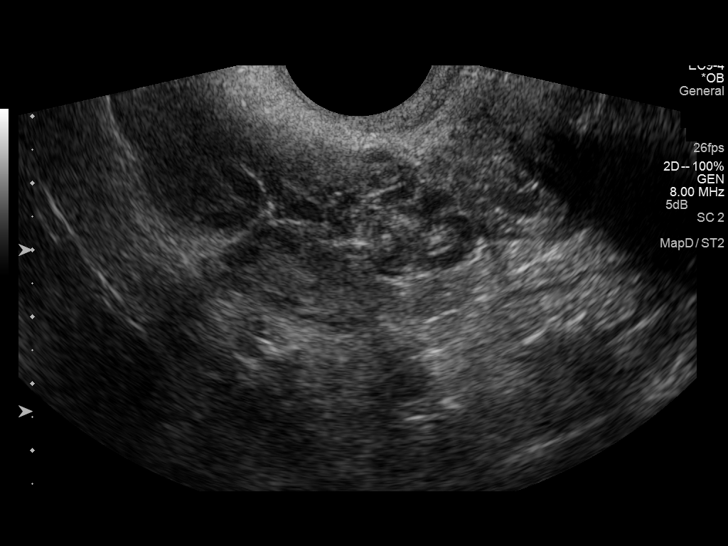
[im 72/78]
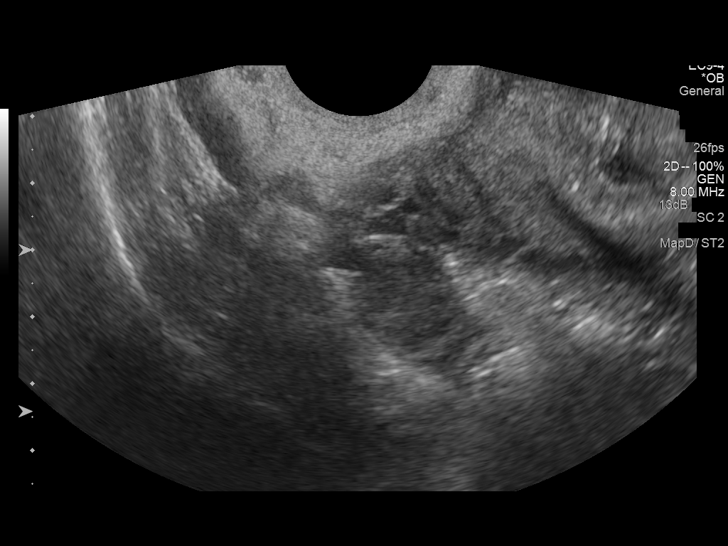
[im 78/78]
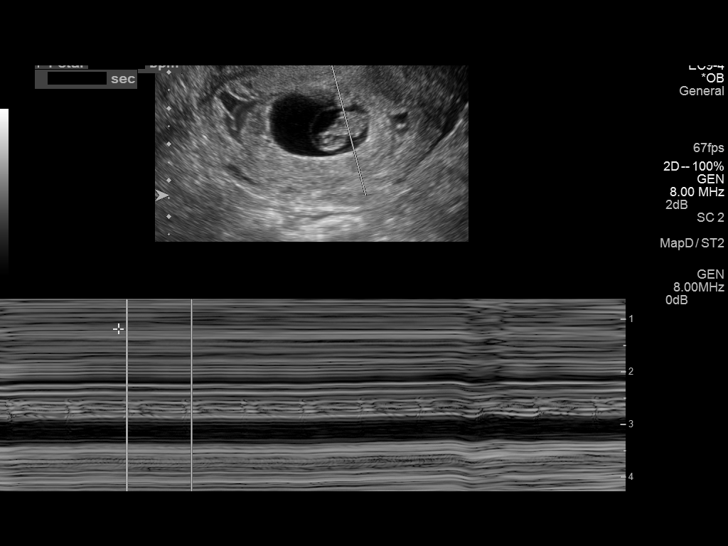

[14 of 28 positions shown; findings below may reference images not displayed]

FINDINGS: Intrauterine gestational sac: Visualized/normal in shape.

Yolk sac:  Visualize.

Embryo:  Present.

Cardiac Activity: Present.

Heart Rate:  158 bpm

CRL:   16.8  mm   8 w 1d                  US EDC: 10/31/2014

Maternal uterus/adnexae: There is no uterine mass. There is a
moderate size subchorionic hemorrhage. The right ovary is normal
appearing in the left ovary is not visualized. There is no pelvic
free fluid.
IMPRESSION: 1. Single live intrauterine pregnancy dating 8 weeks 1 day with an
ultrasound EDC of 10/31/2014.
2. Moderate-sized subchorionic hemorrhage. Attention on follow-up
examination is recommended.
